# Patient Record
Sex: Male | Born: 1995 | Race: White | Hispanic: No | Smoking: Current some day smoker
Health system: Southern US, Community
[De-identification: ages and names within clinical notes are randomized; demographics above are authoritative.]

---
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
## 2014-09-14 ENCOUNTER — Emergency Department (HOSPITAL_COMMUNITY): Payer: Medicaid Other

## 2014-09-14 ENCOUNTER — Encounter (HOSPITAL_COMMUNITY): Payer: Self-pay | Admitting: *Deleted

## 2014-09-14 ENCOUNTER — Emergency Department (HOSPITAL_COMMUNITY)
Admission: EM | Admit: 2014-09-14 | Discharge: 2014-09-14 | Disposition: A | Discharge status: Home / Self Care | Payer: Medicaid Other | Attending: Emergency Medicine | Admitting: Emergency Medicine

## 2014-09-14 DIAGNOSIS — S3992XA Unspecified injury of lower back, initial encounter: Secondary | ICD-10-CM | POA: Diagnosis not present

## 2014-09-14 DIAGNOSIS — Y998 Other external cause status: Secondary | ICD-10-CM | POA: Diagnosis not present

## 2014-09-14 DIAGNOSIS — Y9389 Activity, other specified: Secondary | ICD-10-CM | POA: Diagnosis not present

## 2014-09-14 DIAGNOSIS — M545 Low back pain, unspecified: Secondary | ICD-10-CM

## 2014-09-14 DIAGNOSIS — S299XXA Unspecified injury of thorax, initial encounter: Secondary | ICD-10-CM | POA: Diagnosis not present

## 2014-09-14 DIAGNOSIS — S199XXA Unspecified injury of neck, initial encounter: Secondary | ICD-10-CM | POA: Insufficient documentation

## 2014-09-14 DIAGNOSIS — Y92410 Unspecified street and highway as the place of occurrence of the external cause: Secondary | ICD-10-CM | POA: Insufficient documentation

## 2014-09-14 MED ORDER — IBUPROFEN 600 MG PO TABS: 600.0000 mg | ORAL_TABLET | Freq: Four times a day (QID) | ORAL | Status: DC | PRN

## 2014-09-14 MED ORDER — TRAMADOL HCL 50 MG PO TABS: 50.0000 mg | ORAL_TABLET | Freq: Four times a day (QID) | ORAL | Status: DC | PRN

## 2014-09-14 MED ORDER — DIAZEPAM 5 MG PO TABS: 5.0000 mg | ORAL_TABLET | Freq: Two times a day (BID) | ORAL | Status: DC
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
## 2014-09-14 NOTE — ED Notes (Signed)
Cervical collar placed in triage.

## 2014-10-02 ENCOUNTER — Ambulatory Visit: Payer: Medicaid Other | Attending: Internal Medicine | Admitting: Internal Medicine

## 2014-10-02 ENCOUNTER — Encounter: Payer: Self-pay | Admitting: Internal Medicine

## 2014-10-02 VITALS — BP 140/80 | HR 71 | Temp 97.4°F | Resp 16 | Ht 68.0 in | Wt 381.0 lb

## 2014-10-02 DIAGNOSIS — M542 Cervicalgia: Secondary | ICD-10-CM | POA: Diagnosis not present

## 2014-10-02 DIAGNOSIS — M545 Low back pain, unspecified: Secondary | ICD-10-CM

## 2014-10-02 MED ORDER — TRAMADOL HCL 50 MG PO TABS: 50.0000 mg | ORAL_TABLET | Freq: Four times a day (QID) | ORAL | Status: AC | PRN

## 2014-10-02 MED ORDER — CYCLOBENZAPRINE HCL 10 MG PO TABS: 10.0000 mg | ORAL_TABLET | Freq: Three times a day (TID) | ORAL | Status: AC | PRN

## 2014-10-02 MED ORDER — IBUPROFEN 600 MG PO TABS: 600.0000 mg | ORAL_TABLET | Freq: Four times a day (QID) | ORAL | Status: AC | PRN
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
## 2014-10-02 NOTE — Patient Instructions (Signed)
Back Pain, Adult Low back pain is very common. About 1 in 5 people have back pain.The cause of low back pain is rarely dangerous. The pain often gets better over time.About half of people with a sudden onset of back pain feel better in just 2 weeks. About 8 in 10 people feel better by 6 weeks.  CAUSES Some common causes of back pain include:  Strain of the muscles or ligaments supporting the spine.  Wear and tear (degeneration) of the spinal discs.  Arthritis.  Direct injury to the back. DIAGNOSIS Most of the time, the direct cause of low back pain is not known.However, back pain can be treated effectively even when the exact cause of the pain is unknown.Answering your caregiver's questions about your overall health and symptoms is one of the most accurate ways to make sure the cause of your pain is not dangerous. If your caregiver needs more information, he or she may order lab work or imaging tests (X-rays or MRIs).However, even if imaging tests show changes in your back, this usually does not require surgery. HOME CARE INSTRUCTIONS For many people, back pain returns.Since low back pain is rarely dangerous, it is often a condition that people can learn to manageon their own.   Remain active. It is stressful on the back to sit or stand in one place. Do not sit, drive, or stand in one place for more than 30 minutes at a time. Take short walks on level surfaces as soon as pain allows.Try to increase the length of time you walk each day.  Do not stay in bed.Resting more than 1 or 2 days can delay your recovery.  Do not avoid exercise or work.Your body is made to move.It is not dangerous to be active, even though your back may hurt.Your back will likely heal faster if you return to being active before your pain is gone.  Pay attention to your body when you bend and lift. Many people have less discomfortwhen lifting if they bend their knees, keep the load close to their bodies,and  avoid twisting. Often, the most comfortable positions are those that put less stress on your recovering back.  Find a comfortable position to sleep. Use a firm mattress and lie on your side with your knees slightly bent. If you lie on your back, put a pillow under your knees.  Only take over-the-counter or prescription medicines as directed by your caregiver. Over-the-counter medicines to reduce pain and inflammation are often the most helpful.Your caregiver may prescribe muscle relaxant drugs.These medicines help dull your pain so you can more quickly return to your normal activities and healthy exercise.  Put ice on the injured area.  Put ice in a plastic bag.  Place a towel between your skin and the bag.  Leave the ice on for 15-20 minutes, 03-04 times a day for the first 2 to 3 days. After that, ice and heat may be alternated to reduce pain and spasms.  Ask your caregiver about trying back exercises and gentle massage. This may be of some benefit.  Avoid feeling anxious or stressed.Stress increases muscle tension and can worsen back pain.It is important to recognize when you are anxious or stressed and learn ways to manage it.Exercise is a great option. SEEK MEDICAL CARE IF:  You have pain that is not relieved with rest or medicine.  You have pain that does not improve in 1 week.  You have new symptoms.  You are generally not feeling well. SEEK   IMMEDIATE MEDICAL CARE IF:   You have pain that radiates from your back into your legs.  You develop new bowel or bladder control problems.  You have unusual weakness or numbness in your arms or legs.  You develop nausea or vomiting.  You develop abdominal pain.  You feel faint. Document Released: 06/05/2005 Document Revised: 12/05/2011 Document Reviewed: 10/07/2013 [employer] Patient Information 2015 [employer]. This information is not intended to replace advice given to you by your health care provider. Make sure you  discuss any questions you have with your health care provider.  
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
## 2014-11-19 ENCOUNTER — Ambulatory Visit: Payer: Medicaid Other | Attending: Internal Medicine | Admitting: Internal Medicine

## 2014-11-19 ENCOUNTER — Encounter: Payer: Self-pay | Admitting: Internal Medicine

## 2014-11-19 VITALS — BP 149/84 | HR 80 | Temp 98.1°F | Resp 16 | Wt 382.8 lb

## 2014-11-19 DIAGNOSIS — R030 Elevated blood-pressure reading, without diagnosis of hypertension: Secondary | ICD-10-CM

## 2014-11-19 DIAGNOSIS — IMO0001 Reserved for inherently not codable concepts without codable children: Secondary | ICD-10-CM

## 2014-11-19 DIAGNOSIS — M5442 Lumbago with sciatica, left side: Secondary | ICD-10-CM

## 2014-11-19 NOTE — Patient Instructions (Signed)
Call ortho for your appointment. Low salt and low calorie diet. If your BP is not improved on the next visit then we will need to begin blood pressure medication   Calorie Counting for Weight Loss Calories are energy you get from the things you eat and drink. Your body uses this energy to keep you going throughout the day. The number of calories you eat affects your weight. When you eat more calories than your body needs, your body stores the extra calories as fat. When you eat fewer calories than your body needs, your body burns fat to get the energy it needs. Calorie counting means keeping track of how many calories you eat and drink each day. If you make sure to eat fewer calories than your body needs, you should lose weight. In order for calorie counting to work, you will need to eat the number of calories that are right for you in a day to lose a healthy amount of weight per week. A healthy amount of weight to lose per week is usually 1-2 lb (0.5-0.9 kg). A dietitian can determine how many calories you need in a day and give you suggestions on how to reach your calorie goal.  WHAT IS MY MY PLAN? My goal is to have __________ calories per day.  If I have this many calories per day, I should lose around __________ pounds per week. WHAT DO I NEED TO KNOW ABOUT CALORIE COUNTING? In order to meet your daily calorie goal, you will need to:  Find out how many calories are in each food you would like to eat. Try to do this before you eat.  Decide how much of the food you can eat.  Write down what you ate and how many calories it had. Doing this is called keeping a food log. WHERE DO I FIND CALORIE INFORMATION? The number of calories in a food can be found on a Nutrition Facts label. Note that all the information on a label is based on a specific serving of the food. If a food does not have a Nutrition Facts label, try to look up the calories online or ask your dietitian for help. HOW DO I DECIDE HOW  MUCH TO EAT? To decide how much of the food you can eat, you will need to consider both the number of calories in one serving and the size of one serving. This information can be found on the Nutrition Facts label. If a food does not have a Nutrition Facts label, look up the information online or ask your dietitian for help. Remember that calories are listed per serving. If you choose to have more than one serving of a food, you will have to multiply the calories per serving by the amount of servings you plan to eat. For example, the label on a package of bread might say that a serving size is 1 slice and that there are 90 calories in a serving. If you eat 1 slice, you will have eaten 90 calories. If you eat 2 slices, you will have eaten 180 calories. HOW DO I KEEP A FOOD LOG? After each meal, record the following information in your food log:  What you ate.  How much of it you ate.  How many calories it had.  Then, add up your calories. Keep your food log near you, such as in a small notebook in your pocket. Another option is to use a mobile app or website. Some programs will calculate calories  for you and show you how many calories you have left each time you add an item to the log. WHAT ARE SOME CALORIE COUNTING TIPS?  Use your calories on foods and drinks that will fill you up and not leave you hungry. Some examples of this include foods like nuts and nut butters, vegetables, lean proteins, and high-fiber foods (more than 5 g fiber per serving).  Eat nutritious foods and avoid empty calories. Empty calories are calories you get from foods or beverages that do not have many nutrients, such as candy and soda. It is better to have a nutritious high-calorie food (such as an avocado) than a food with few nutrients (such as a bag of chips).  Know how many calories are in the foods you eat most often. This way, you do not have to look up how many calories they have each time you eat them.  Look  out for foods that may seem like low-calorie foods but are really high-calorie foods, such as baked goods, soda, and fat-free candy.  Pay attention to calories in drinks. Drinks such as sodas, specialty coffee drinks, alcohol, and juices have a lot of calories yet do not fill you up. Choose low-calorie drinks like water and diet drinks.  Focus your calorie counting efforts on higher calorie items. Logging the calories in a garden salad that contains only vegetables is less important than calculating the calories in a milk shake.  Find a way of tracking calories that works for you. Get creative. Most people who are successful find ways to keep track of how much they eat in a day, even if they do not count every calorie. WHAT ARE SOME PORTION CONTROL TIPS?  Know how many calories are in a serving. This will help you know how many servings of a certain food you can have.  Use a measuring cup to measure serving sizes. This is helpful when you start out. With time, you will be able to estimate serving sizes for some foods.  Take some time to put servings of different foods on your favorite plates, bowls, and cups so you know what a serving looks like.  Try not to eat straight from a bag or box. Doing this can lead to overeating. Put the amount you would like to eat in a cup or on a plate to make sure you are eating the right portion.  Use smaller plates, glasses, and bowls to prevent overeating. This is a quick and easy way to practice portion control. If your plate is smaller, less food can fit on it.  Try not to multitask while eating, such as watching TV or using your computer. If it is time to eat, sit down at a table and enjoy your food. Doing this will help you to start recognizing when you are full. It will also make you more aware of what and how much you are eating. HOW CAN I CALORIE COUNT WHEN EATING OUT?  Ask for smaller portion sizes or child-sized portions.  Consider sharing an entree  and sides instead of getting your own entree.  If you get your own entree, eat only half. Ask for a box at the beginning of your meal and put the rest of your entree in it so you are not tempted to eat it.  Look for the calories on the menu. If calories are listed, choose the lower calorie options.  Choose dishes that include vegetables, fruits, whole grains, low-fat dairy products, and lean protein.  Focusing on smart food choices from each of the 5 food groups can help you stay on track at restaurants.  Choose items that are boiled, broiled, grilled, or steamed.  Choose water, milk, unsweetened iced tea, or other drinks without added sugars. If you want an alcoholic beverage, choose a lower calorie option. For example, a regular margarita can have up to 700 calories and a glass of wine has around 150.  Stay away from items that are buttered, battered, fried, or served with cream sauce. Items labeled "crispy" are usually fried, unless stated otherwise.  Ask for dressings, sauces, and syrups on the side. These are usually very high in calories, so do not eat much of them.  Watch out for salads. Many people think salads are a healthy option, but this is often not the case. Many salads come with bacon, fried chicken, lots of cheese, fried chips, and dressing. All of these items have a lot of calories. If you want a salad, choose a garden salad and ask for grilled meats or steak. Ask for the dressing on the side, or ask for olive oil and vinegar or lemon to use as dressing.  Estimate how many servings of a food you are given. For example, a serving of cooked rice is  cup or about the size of half a tennis ball or one cupcake wrapper. Knowing serving sizes will help you be aware of how much food you are eating at restaurants. The list below tells you how big or small some common portion sizes are based on everyday objects.  1 oz--4 stacked dice.  3 oz--1 deck of cards.  1 tsp--1 dice.  1  Tbsp-- a Ping-Pong ball.  2 Tbsp--1 Ping-Pong ball.   cup--1 tennis ball or 1 cupcake wrapper.  1 cup--1 baseball. Document Released: 06/05/2005 Document Revised: 10/20/2013 Document Reviewed: 04/10/2013 [facility] Patient Information 2015 [place], [place]. This information is not intended to replace advice given to you by your health care provider. Make sure you discuss any questions you have with your health care provider.
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
## 2020-01-03 ENCOUNTER — Emergency Department (HOSPITAL_COMMUNITY)
Admission: EM | Admit: 2020-01-03 | Discharge: 2020-01-03 | Disposition: A | Discharge status: Home / Self Care | Payer: Self-pay | Attending: Emergency Medicine | Admitting: Emergency Medicine

## 2020-01-03 ENCOUNTER — Encounter (HOSPITAL_COMMUNITY): Payer: Self-pay

## 2020-01-03 ENCOUNTER — Other Ambulatory Visit: Payer: Self-pay

## 2020-01-03 DIAGNOSIS — K029 Dental caries, unspecified: Secondary | ICD-10-CM | POA: Insufficient documentation

## 2020-01-03 DIAGNOSIS — K006 Disturbances in tooth eruption: Secondary | ICD-10-CM | POA: Insufficient documentation

## 2020-01-03 MED ORDER — PENICILLIN V POTASSIUM 500 MG PO TABS: 500.0000 mg | ORAL_TABLET | Freq: Four times a day (QID) | ORAL | 0 refills | Status: AC

## 2020-01-03 NOTE — ED Provider Notes (Signed)
[facility]-EMERGENCY DEPT Provider Note   CSN: [id number] Arrival date & time: 01/03/20  1342     History Chief Complaint  Patient presents with  . Dental Pain    Trevor Atkinson is a 24 y.o. male.  24 year old male with complaint of left upper and right lower dental pain for the past few days. Patient is scheduled to see his dentist on Monday, comes in today due to pain, denies trauma, fever, drainage, states he believes his pain is coming from his wisdom teeth coming in. No other complaints or concerns.         History reviewed. No pertinent past medical history.  There are no problems to display for this patient.   No past surgical history on file.     Family History  Problem Relation Age of Onset  . Heart disease Maternal Uncle     Social History   Tobacco Use  . Smoking status: Never Smoker  . Smokeless tobacco: Current User  Substance Use Topics  . Alcohol use: No  . Drug use: No    Home Medications Prior to Admission medications   Medication Sig Start Date End Date Taking? Authorizing Provider  cyclobenzaprine (FLEXERIL) 10 MG tablet Take 1 tablet (10 mg total) by mouth 3 (three) times daily as needed for muscle spasms. 10/02/14   Ambrose Finland, NP  ibuprofen (ADVIL,MOTRIN) 600 MG tablet Take 1 tablet (600 mg total) by mouth every 6 (six) hours as needed. 10/02/14   Ambrose Finland, NP  penicillin v potassium (VEETID) 500 MG tablet Take 1 tablet (500 mg total) by mouth 4 (four) times daily for 10 days. 01/03/20 01/13/20  Jeannie Fend, PA-C  traMADol (ULTRAM) 50 MG tablet Take 1 tablet (50 mg total) by mouth every 6 (six) hours as needed. 10/02/14   Ambrose Finland, NP    Allergies    Patient has no known allergies.  Review of Systems   Review of Systems  Constitutional: Negative for fever.  HENT: Positive for dental problem. Negative for ear pain, facial swelling, trouble swallowing and voice change.   Gastrointestinal:  Negative for nausea and vomiting.  Musculoskeletal: Negative for neck pain.  Skin: Negative for rash and wound.  Allergic/Immunologic: Negative for immunocompromised state.  Neurological: Negative for headaches.  Hematological: Negative for adenopathy.  Psychiatric/Behavioral: Negative for confusion.  All other systems reviewed and are negative.   Physical Exam Updated Vital Signs BP (!) 141/99 (BP Location: Left Arm)   Pulse 76   Temp 98.6 F (37 C) (Oral)   Resp 18   Ht 5\' 7"  (1.702 m)   Wt (!) 170.7 kg   SpO2 97%   BMI 58.95 kg/m   Physical Exam Vitals and nursing note reviewed.  Constitutional:      General: He is not in acute distress.    Appearance: He is well-developed. He is not diaphoretic.  HENT:     Head: Normocephalic and atraumatic.     Jaw: No trismus.     Mouth/Throat:     Mouth: Mucous membranes are moist.     Pharynx: Uvula midline. No uvula swelling.      Comments: No sublingual swelling Pulmonary:     Effort: Pulmonary effort is normal.  Musculoskeletal:     Cervical back: Neck supple.  Lymphadenopathy:     Cervical: No cervical adenopathy.  Skin:    General: Skin is warm and dry.     Findings: No erythema  or rash.  Neurological:     Mental Status: He is alert and oriented to person, place, and time.  Psychiatric:        Behavior: Behavior normal.     ED Results / Procedures / Treatments   Labs (all labs ordered are listed, but only abnormal results are displayed) Labs Reviewed - No data to display  EKG None  Radiology No results found.  Procedures Procedures (including critical care time)  Medications Ordered in ED Medications - No data to display  ED Course  I have reviewed the triage vital signs and the nursing notes.  Pertinent labs & imaging results that were available during my care of the patient were reviewed by me and considered in my medical decision making (see chart for details).  Clinical Course as of Jan 03 1535  Sat Jan 03, 2020  5052 24 year old male with dental pain as above. Suspect left upper wisdom tooth with cavity, right lower with pain due to eruption of tooth. Will treat with PCN, recommend advil and tylenol for pain. Follow up with dentist on Monday as planned, given dental resource list as well.    [LM]    Clinical Course User Index [LM] Alden Hipp   MDM Rules/Calculators/A&P                          Final Clinical Impression(s) / ED Diagnoses Final diagnoses:  Dental caries  Eruption, tooth, disturbance of    Rx / DC Orders ED Discharge Orders         Ordered    penicillin v potassium (VEETID) 500 MG tablet  4 times daily     Discontinue  Reprint     01/03/20 1520           Jeannie Fend, PA-C 01/03/20 1536    Virgina Norfolk, DO 01/04/20 0901

## 2020-01-03 NOTE — ED Triage Notes (Signed)
He c/ a sore tooth at right lower and another at left upper mouth. He is in no distress.

## 2020-01-03 NOTE — Discharge Instructions (Addendum)
Take penicillin as prescribed and complete the full course. Take 600 mg of Advil liquid gels and 650 mg of Tylenol every 6 hours as needed as directed for pain. Rinse with Listerine after every meal. Follow-up with your dentist as planned on Monday.

## 2020-12-18 ENCOUNTER — Emergency Department (HOSPITAL_BASED_OUTPATIENT_CLINIC_OR_DEPARTMENT_OTHER)
Admit: 2020-12-18 | Discharge: 2020-12-18 | Disposition: A | Discharge status: Home / Self Care | Payer: BC Managed Care – PPO | Attending: Emergency Medicine | Admitting: Emergency Medicine

## 2020-12-18 ENCOUNTER — Encounter (HOSPITAL_COMMUNITY): Payer: Self-pay

## 2020-12-18 ENCOUNTER — Inpatient Hospital Stay (HOSPITAL_COMMUNITY): Admit: 2020-12-18 | Payer: BC Managed Care – PPO

## 2020-12-18 ENCOUNTER — Emergency Department (HOSPITAL_COMMUNITY)
Admission: EM | Admit: 2020-12-18 | Discharge: 2020-12-18 | Disposition: A | Discharge status: Home / Self Care | Payer: BC Managed Care – PPO | Attending: Emergency Medicine | Admitting: Emergency Medicine

## 2020-12-18 ENCOUNTER — Other Ambulatory Visit: Payer: Self-pay

## 2020-12-18 DIAGNOSIS — M79605 Pain in left leg: Secondary | ICD-10-CM

## 2020-12-18 DIAGNOSIS — F17210 Nicotine dependence, cigarettes, uncomplicated: Secondary | ICD-10-CM | POA: Insufficient documentation

## 2020-12-18 MED ORDER — NAPROXEN 500 MG PO TABS: 500.0000 mg | ORAL_TABLET | Freq: Two times a day (BID) | ORAL | 0 refills | Status: AC

## 2020-12-18 MED ORDER — KETOROLAC TROMETHAMINE 30 MG/ML IJ SOLN
30.0000 mg | Freq: Once | INTRAMUSCULAR | Status: AC
  Administered 2020-12-18: 30 mg via INTRAMUSCULAR
  Filled 2020-12-18: qty 1

## 2020-12-18 NOTE — Discharge Instructions (Addendum)
Use prescribed naproxen twice daily, Tylenol 1000 mg every 6 hours to help with pain.  You can also apply topical Salonpas lidocaine patches and use ice and heat.  Please follow-up with PCP or sports medicine if symptoms or not improving.  Your ultrasound today showed no signs of blood clot.

## 2020-12-18 NOTE — ED Triage Notes (Signed)
Pt reports left leg pain started Thursday. Pt reports difficulty walking this morning due to pain in leg.  Pt reports taking IBU this morning around 9am.

## 2020-12-18 NOTE — Progress Notes (Signed)
Left lower extremity venous duplex has been completed. Preliminary results can be found in CV Proc through chart review.  Results were given to Jodi Geralds PA.  12/18/20 4:48 PM Olen Cordial RVT
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
## 2020-12-19 ENCOUNTER — Other Ambulatory Visit: Payer: Self-pay

## 2020-12-19 DIAGNOSIS — M545 Low back pain, unspecified: Secondary | ICD-10-CM

## 2022-01-05 ENCOUNTER — Emergency Department (HOSPITAL_COMMUNITY)
Admission: EM | Admit: 2022-01-05 | Discharge: 2022-01-05 | Disposition: A | Discharge status: Home / Self Care | Payer: BC Managed Care – PPO | Attending: Emergency Medicine | Admitting: Emergency Medicine

## 2022-01-05 ENCOUNTER — Other Ambulatory Visit: Payer: Self-pay

## 2022-01-05 DIAGNOSIS — L0501 Pilonidal cyst with abscess: Secondary | ICD-10-CM | POA: Insufficient documentation

## 2022-01-05 MED ORDER — SULFAMETHOXAZOLE-TRIMETHOPRIM 800-160 MG PO TABS: 1.0000 | ORAL_TABLET | Freq: Two times a day (BID) | ORAL | 0 refills | Status: AC

## 2022-01-05 MED ORDER — LIDOCAINE HCL (PF) 1 % IJ SOLN
5.0000 mL | Freq: Once | INTRAMUSCULAR | Status: AC
  Administered 2022-01-05: 5 mL
  Filled 2022-01-05: qty 30

## 2022-01-05 NOTE — ED Triage Notes (Signed)
Patient coming to ED for evaluation of cyst x 3 days.  Reports "I went to Urgent Care and they sent me here because they said I might have a cyst on by buttocks."  No reports of fevers.  No drainage.

## 2022-01-05 NOTE — ED Provider Notes (Signed)
[facility]-EMERGENCY DEPT Provider Note   CSN: [id number] Arrival date & time: 01/05/22  1953     History  Chief Complaint  Patient presents with   Cyst    Trevor Atkinson is a 26 y.o. male.  HPI  26 year old male presents emergency department with complaints of cyst.  He states that he noticed pain as well as mass "on my buttocks" approximately 3 days ago.  He notes increase in size as well as drainage from the area.  He went to urgent care earlier today who sent him to the emergency department for reassessment.  He notes history of this happening 1 time before.  He denies fever, chills, night sweats, chest pain, shortness of breath, abdominal pain, nausea/vomiting/diarrhea, urinary symptoms, change in bowel habits.  Home Medications Prior to Admission medications   Medication Sig Start Date End Date Taking? Authorizing Provider  sulfamethoxazole-trimethoprim (BACTRIM DS) 800-160 MG tablet Take 1 tablet by mouth 2 (two) times daily for 7 days. 01/05/22 01/12/22 Yes Sherian Maroon A, PA  cyclobenzaprine (FLEXERIL) 10 MG tablet Take 1 tablet (10 mg total) by mouth 3 (three) times daily as needed for muscle spasms. 10/02/14   Ambrose Finland, NP  ibuprofen (ADVIL,MOTRIN) 600 MG tablet Take 1 tablet (600 mg total) by mouth every 6 (six) hours as needed. 10/02/14   Ambrose Finland, NP  naproxen (NAPROSYN) 500 MG tablet Take 1 tablet (500 mg total) by mouth 2 (two) times daily. 12/18/20   Dartha Lodge, PA-C  traMADol (ULTRAM) 50 MG tablet Take 1 tablet (50 mg total) by mouth every 6 (six) hours as needed. 10/02/14   Ambrose Finland, NP      Allergies    Patient has no known allergies.    Review of Systems   Review of Systems  Constitutional:  Negative for chills and fever.  HENT:  Negative for ear pain and sore throat.   Eyes:  Negative for pain and visual disturbance.  Respiratory:  Negative for cough and shortness of breath.   Cardiovascular:  Negative for  chest pain and palpitations.  Gastrointestinal:  Negative for abdominal pain and vomiting.  Genitourinary:  Negative for dysuria and hematuria.  Musculoskeletal:  Negative for arthralgias and back pain.  Skin:  Negative for color change and rash.  Neurological:  Negative for seizures and syncope.  All other systems reviewed and are negative.   Physical Exam Updated Vital Signs BP (!) 147/89 (BP Location: Left Arm)   Pulse (!) 110   Temp 98.2 F (36.8 C) (Oral)   Resp 18   Ht 5\' 9"  (1.753 m)   SpO2 100%   BMI 70.88 kg/m  Physical Exam Vitals and nursing note reviewed.  Constitutional:      General: He is not in acute distress.    Appearance: He is well-developed.  HENT:     Head: Normocephalic and atraumatic.  Eyes:     Conjunctiva/sclera: Conjunctivae normal.  Cardiovascular:     Rate and Rhythm: Normal rate and regular rhythm.     Heart sounds: No murmur heard. Pulmonary:     Effort: Pulmonary effort is normal. No respiratory distress.     Breath sounds: Normal breath sounds.  Abdominal:     Palpations: Abdomen is soft.     Tenderness: There is no abdominal tenderness.  Genitourinary:    Comments: Area of fluctuance noted midline the superior aspect of patient's intergluteal cleft.  Area of induration noted just right superior lateral  with surrounding erythema.  Area is actively draining when squeezed. Musculoskeletal:        General: No swelling.     Cervical back: Neck supple.  Skin:    General: Skin is warm and dry.     Capillary Refill: Capillary refill takes less than 2 seconds.  Neurological:     Mental Status: He is alert.  Psychiatric:        Mood and Affect: Mood normal.     ED Results / Procedures / Treatments   Labs (all labs ordered are listed, but only abnormal results are displayed) Labs Reviewed - No data to display  EKG None  Radiology No results found.  Procedures .Marland KitchenIncision and Drainage  Date/Time: 01/05/2022 9:19 PM  Performed  by: Peter Garter, PA Authorized by: Peter Garter, PA   Consent:    Consent obtained:  Verbal   Consent given by:  Patient   Risks, benefits, and alternatives were discussed: yes     Risks discussed:  Bleeding, incomplete drainage, damage to other organs, infection and pain   Alternatives discussed:  No treatment, delayed treatment, alternative treatment, referral and observation Universal protocol:    Procedure explained and questions answered to patient or proxy's satisfaction: yes     Patient identity confirmed:  Verbally with patient Location:    Type:  Abscess   Size:  2.5 cm   Location:  Anogenital   Anogenital location:  Pilonidal Pre-procedure details:    Skin preparation:  Chlorhexidine with alcohol Sedation:    Sedation type:  None Anesthesia:    Anesthesia method:  Local infiltration   Local anesthetic:  Lidocaine 1% w/o epi Procedure type:    Complexity:  Simple Procedure details:    Ultrasound guidance: no     Needle aspiration: no     Incision types:  Single straight   Drainage:  Bloody and purulent   Drainage amount:  Copious   Wound treatment:  Wound left open Post-procedure details:    Procedure completion:  Tolerated well, no immediate complications Comments:     Area was irrigated with 500 mL of normal saline until no purulent drainage noticed.     Medications Ordered in ED Medications  lidocaine (PF) (XYLOCAINE) 1 % injection 5 mL (5 mLs Other Given by Other 01/05/22 2105)    ED Course/ Medical Decision Making/ A&P                           Medical Decision Making  This patient presents to the ED for concern of cyst, this involves an extensive number of treatment options, and is a complaint that carries with it a high risk of complications and morbidity.  The differential diagnosis includes osteomyelitis, pilonidal cyst, cellulitis, erysipelas, necrotizing fasciitis   Co morbidities that complicate the patient evaluation  Obesity,  cigarette use   Lab Tests:  N/a   Imaging Studies ordered:  N/a   Cardiac Monitoring: / EKG:  The patient was maintained on a cardiac monitor.  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  Recommend close follow-up PCP regarding hypertension.. Otherwise within normal range and stable throughout visit. Imaging studies considered to evaluate the extent of abscess but active draining upon presentation as well as palpable fluctuance confirmed presence of abscess but no need for ultrasound-guided.  Abscess was drained and manner as depicted above.  Patient was given antibiotics given cellulitic skin changes extending beyond area of abscess.  Patient educated regarding proper hygiene to prevent recurrence.  He is also given information to follow-up with a general surgeon regarding if notices recurrence.   Worrisome signs and symptoms were discussed with the patient, and the patient acknowledged understanding to return to the ED if noticed. Patient was stable upon discharge.         Final Clinical Impression(s) / ED Diagnoses Final diagnoses:  Pilonidal cyst with abscess    Rx / DC Orders ED Discharge Orders          Ordered    sulfamethoxazole-trimethoprim (BACTRIM DS) 800-160 MG tablet  2 times daily        01/05/22 2127               Peter Garter, [place] 01/05/22 2128    Bethann Berkshire, MD 01/08/22 [phone]

## 2022-01-05 NOTE — ED Notes (Signed)
I provided reinforced discharge education based off of discharge summary/care provided. Pt acknowledged and understood my education. Pt had no further questions/concerns for provider/myself.   

## 2022-01-05 NOTE — Discharge Instructions (Addendum)
See information attached for proper care postdrainage of your pilonidal cyst.  Keep area clean and dry and covered.  Take the antibiotic as directed twice a day for the next 7 days.  I attached information regarding a general surgeon to follow-up with regarding her pilonidal abscess.  Further imaging may be necessary if this becomes a recurring event.  Please not hesitate to return to the emergency department if the worrisome signs and symptoms we discussed become apparent.

## 2022-09-21 DIAGNOSIS — H5213 Myopia, bilateral: Secondary | ICD-10-CM | POA: Diagnosis not present

## 2023-02-11 DIAGNOSIS — R7989 Other specified abnormal findings of blood chemistry: Secondary | ICD-10-CM | POA: Diagnosis not present

## 2023-02-11 DIAGNOSIS — R0789 Other chest pain: Secondary | ICD-10-CM | POA: Diagnosis not present

## 2023-02-11 DIAGNOSIS — I10 Essential (primary) hypertension: Secondary | ICD-10-CM | POA: Diagnosis not present

## 2023-04-25 ENCOUNTER — Telehealth: Payer: Self-pay | Admitting: General Practice

## 2023-04-25 ENCOUNTER — Ambulatory Visit: Payer: Medicaid Other | Admitting: Family Medicine

## 2023-04-25 NOTE — Telephone Encounter (Signed)
Called pt to reschedule missed NP appt; could not reach and could not leave vm

## 2023-11-19 ENCOUNTER — Encounter: Payer: Self-pay | Admitting: General Practice

## 2023-11-30 ENCOUNTER — Encounter: Admitting: Family

## 2023-11-30 NOTE — Progress Notes (Signed)
 Erroneous encounter-disregard

## 2024-04-04 ENCOUNTER — Ambulatory Visit: Admitting: Family Medicine

## 2024-04-04 ENCOUNTER — Telehealth: Payer: Self-pay | Admitting: General Practice

## 2024-04-04 NOTE — Telephone Encounter (Signed)
 Called pt and left vm to call office back to reschedule missed appt

## 2024-04-28 ENCOUNTER — Ambulatory Visit (HOSPITAL_BASED_OUTPATIENT_CLINIC_OR_DEPARTMENT_OTHER): Admitting: Family Medicine

## 2024-05-06 ENCOUNTER — Ambulatory Visit (HOSPITAL_BASED_OUTPATIENT_CLINIC_OR_DEPARTMENT_OTHER): Admitting: Family Medicine
